# Patient Record
Sex: Female | Born: 1970 | Hispanic: Yes | Marital: Married
Health system: Southern US, Community
[De-identification: ages and names within clinical notes are randomized; demographics above are authoritative.]

---

## 2006-08-16 ENCOUNTER — Emergency Department: Payer: Self-pay | Admitting: Emergency Medicine

## 2009-09-22 ENCOUNTER — Ambulatory Visit: Payer: Self-pay

## 2012-01-18 ENCOUNTER — Ambulatory Visit: Payer: Self-pay

## 2012-01-30 ENCOUNTER — Ambulatory Visit: Payer: Self-pay

## 2013-06-26 ENCOUNTER — Ambulatory Visit: Payer: Self-pay

## 2016-05-23 NOTE — Progress Notes (Signed)
 Megan Santos is a 44 y.o. female here for new (WORK COMP) patient visit to discuss:  History of Present Illness:   1. Back pain: Comes in today with reports of bilateral posterior shoulder/upper back pain after catching a basket of falling socks while at work today 05/23/2016.  Denies numbness, weakness, tingling, bruising, redness, swelling.  Pain is worse with lifting.  Pain is worse with bending.  No history of back or shoulder injury or surgery.  No medications taken for symptoms.   The following portions of the patient's history were reviewed and updated as appropriate.  Past Medical History:  History reviewed. No pertinent past medical history.  Past Surgical History:  History reviewed. No pertinent surgical history.  Allergies:   Allergies  Allergen Reactions  . Penicillin Rash    Current Medications:   Prior to Admission medications   Not on File    Family History:  History reviewed. No pertinent family history.  Social History:   Social History   Social History  . Marital status: Married    Spouse name: N/A  . Number of children: N/A  . Years of education: N/A   Occupational History  . Not on file.   Social History Main Topics  . Smoking status: Never Smoker  . Smokeless tobacco: Not on file  . Alcohol use Not on file  . Drug use: Not on file  . Sexual activity: Not on file   Other Topics Concern  . Not on file   Social History Narrative  . No narrative on file    Review of Systems:   As per HPI  Vitals:   Vitals:   05/23/16 1730  BP: 126/82  Pulse: 101  Temp: 36.4 C (97.5 F)  TempSrc: Oral  SpO2: 98%  Weight: (!) 114.3 kg (252 lb)  Height: 157.5 cm (5' 2)     Body mass index is 46.09 kg/(m^2).  Physical Exam:   General:  Well appearing, pleasant, no acute distress,  Mouth:  Moist mucous membranes Lungs:  Clear to auscultation bilaterally without wheeze, rale or rhonchi Heart:  Regular rate and rhythm without murmur or  gallop Back: No erythema edema or ecchymosis noted.  Patient is tender to palpation in bilateral thoracic paraspinal muscles and overlying skin change.  No lumbar paraspinal muscle tenderness to palpation.  No thoracic or lumbar spinous process tenderness to palpation.  Decreased range of motion flexion of the thoracolumbar spine secondary to reported pain. Skin:  Warm and dry with good turgor Neurologic:  Alert.  5 out of 5 strength bilateral lower extremities with hip flexion, knee extension, ankle flexion and extension.  Sensation intact light touch bilateral lower extremities.  Plain film thoracic spine:   No acute bony process. Degenerative changes noted.  Assessment and Plan:   1. Acute upper back pain, unspecified  (primary encounter diagnosis) Encounter related to worker's compensation claim: Acute.  Likely secondary to muscle strain.  Pain films as above without acute bony process however degenerative changes noted multilevel.  Baclofen for muscle relaxer.  Naproxen for pain and inflammation.  Over-the-counter Tylenol for pain.  Follow-up as per patient instructions.  Restricted duty as per patient instructions.  -     baclofen (LIORESAL) 10 MG tablet; Take 1 tablet (10 mg total) by mouth 3 (three) times daily as needed (muscle spasm). -     naproxen (NAPROSYN) 500 MG tablet; Take 1 tablet (500 mg total) by mouth 2 (two) times daily as needed (pain) for  up to 30 doses.    Portions of this note were created using dictation software and may contain typographical errors.   Patient received an After Visit Summary

## 2016-05-30 NOTE — Progress Notes (Signed)
 Megan Santos is a 45 y.o. female here for established (WORK COMP) patient visit to discuss:  History of Present Illness:   1. Back pain: Comes in today for follow up of bilateral posterior shoulder/upper back pain after catching a basket of falling socks while at work on 05/23/2016.  Plain film of the lumbar spine demonstrated degenerative changes without acute process.  Pain was felt to be muscle strain related.  Treated with baclofen, naproxen.  Here for follow-up. Pain resolved.  Denies numbness, weakness, tingling, bruising, redness, swelling.  No history of back or shoulder injury or surgery.    The following portions of the patient's history were reviewed and updated as appropriate.  Past Medical History:  History reviewed. No pertinent past medical history.  Past Surgical History:  History reviewed. No pertinent surgical history.  Allergies:   Allergies  Allergen Reactions  . Penicillin Rash    Current Medications:   Prior to Admission medications   Medication Sig Taking? Last Dose  baclofen (LIORESAL) 10 MG tablet Take 1 tablet (10 mg total) by mouth 3 (three) times daily as needed (muscle spasm). Yes Taking  naproxen (NAPROSYN) 500 MG tablet Take 1 tablet (500 mg total) by mouth 2 (two) times daily as needed (pain) for up to 30 doses. Yes Taking    Family History:  No family history on file.  Social History:   Social History   Social History  . Marital status: Married    Spouse name: N/A  . Number of children: N/A  . Years of education: N/A   Occupational History  . Not on file.   Social History Main Topics  . Smoking status: Never Smoker  . Smokeless tobacco: Not on file  . Alcohol use Not on file  . Drug use: Not on file  . Sexual activity: Not on file   Other Topics Concern  . Not on file   Social History Narrative    Review of Systems:   As per HPI  Vitals:   Vitals:   05/30/16 1545  BP: 117/80  Pulse: 79  Temp: 36.7 C (98 F)   SpO2: 93%  Weight: (!) 115.2 kg (254 lb)     Body mass index is 46.46 kg/(m^2).  Physical Exam:   General:  Well appearing, pleasant, no acute distress,  Mouth:  Moist mucous membranes Back: No erythema edema or ecchymosis noted. Non tender to palpation bilateral thoracic paraspinal muscles. No lumbar paraspinal muscle tenderness to palpation.  No thoracic or lumbar spinous process tenderness to palpation. Full range of motion flexion of the thoracolumbar spine Skin:  Warm and dry with good turgor Neurologic:  Alert.  5 out of 5 strength bilateral lower extremities with hip flexion, knee extension, ankle flexion and extension.  Sensation intact light touch bilateral lower extremities.  Assessment and Plan:   1. Acute upper back pain, unspecified  (primary encounter diagnosis) Encounter related to worker's compensation claim: Acute. Resolved.  Return to regular duty.  Follow-up as needed.  -     baclofen (LIORESAL) 10 MG tablet; Take 1 tablet (10 mg total) by mouth 3 (three) times daily as needed (muscle spasm). -     naproxen (NAPROSYN) 500 MG tablet; Take 1 tablet (500 mg total) by mouth 2 (two) times daily as needed (pain) for up to 30 doses.    Portions of this note were created using dictation software and may contain typographical errors.   Patient received an After Visit Summary

## 2019-05-22 ENCOUNTER — Other Ambulatory Visit: Payer: Self-pay | Admitting: Family Medicine

## 2019-05-22 DIAGNOSIS — Z1231 Encounter for screening mammogram for malignant neoplasm of breast: Secondary | ICD-10-CM

## 2019-06-14 ENCOUNTER — Other Ambulatory Visit: Payer: Self-pay

## 2019-06-19 ENCOUNTER — Other Ambulatory Visit: Payer: Self-pay

## 2019-06-19 ENCOUNTER — Ambulatory Visit: Payer: Self-pay | Attending: Oncology | Admitting: *Deleted

## 2019-06-19 ENCOUNTER — Ambulatory Visit
Admission: RE | Admit: 2019-06-19 | Discharge: 2019-06-19 | Disposition: A | Discharge status: Home / Self Care | Payer: Self-pay | Source: Ambulatory Visit | Attending: Oncology | Admitting: Oncology

## 2019-06-19 ENCOUNTER — Encounter: Payer: Self-pay | Admitting: *Deleted

## 2019-06-19 VITALS — BP 161/72 | HR 81 | Temp 98.5°F | Ht 62.0 in | Wt 256.0 lb

## 2019-06-19 DIAGNOSIS — Z0000 Encounter for general adult medical examination without abnormal findings: Secondary | ICD-10-CM

## 2019-06-19 NOTE — Progress Notes (Signed)
  Subjective:     Patient ID: Megan Santos, female   DOB: [birth year], 48 y.o.   MRN: [id number]  HPI   Review of Systems     Objective:   Physical Exam Chest:     Breasts:        Right: No swelling, bleeding, inverted nipple, mass, nipple discharge, skin change or tenderness.        Left: No swelling, bleeding, inverted nipple, mass, nipple discharge, skin change or tenderness.  Abdominal:     Palpations: There is no hepatomegaly or splenomegaly.  Genitourinary:    Exam position: Lithotomy position.     Labia:        Right: No rash, tenderness, lesion or injury.        Left: No rash, tenderness, lesion or injury.      Vagina: No signs of injury and foreign body. No vaginal discharge, erythema, tenderness, bleeding, lesions or prolapsed vaginal walls.     Cervix: No cervical motion tenderness, discharge, friability, lesion, erythema, cervical bleeding or eversion.     Uterus: Not deviated, not enlarged, not fixed, not tender and no uterine prolapse.      Adnexa:        Right: No mass, tenderness or fullness.         Left: No mass, tenderness or fullness.       Rectum: No mass.     Lymphadenopathy:     Upper Body:     Right upper body: No supraclavicular or axillary adenopathy.     Left upper body: No supraclavicular or axillary adenopathy.        Assessment:     48 year old Hispanic female presents to [facility] for clinical breast exam, pap and mammogram.  Leola Brazil, the interpreter present during the interview and exam.  Clinical breast exam unremarkable.  Taught self breast awareness.  Specimen collected for pap smear without difficulty.  Patient has been screened for eligibility.  She does not have any insurance, Medicare or Medicaid.  She also meets financial eligibility.  Hand-out given on the Affordable Care Act.  Risk Assessment    Risk Scores      06/19/2019   Last edited by: Orson Slick, CMA   5-year risk: 0.4 %   Lifetime risk: 4.8 %            Plan:        Screening mammogram ordered.  Specimen for pap sent to the lab.  Will follow up per [facility] protocol.

## 2019-06-21 ENCOUNTER — Other Ambulatory Visit: Payer: Self-pay | Admitting: *Deleted

## 2019-06-21 DIAGNOSIS — N6489 Other specified disorders of breast: Secondary | ICD-10-CM

## 2019-06-28 LAB — PAP LB AND HPV HIGH-RISK: HPV, high-risk: NEGATIVE

## 2019-07-05 ENCOUNTER — Ambulatory Visit
Admission: RE | Admit: 2019-07-05 | Discharge: 2019-07-05 | Disposition: A | Discharge status: Home / Self Care | Payer: Self-pay | Source: Ambulatory Visit | Attending: Oncology | Admitting: Oncology

## 2019-07-05 DIAGNOSIS — N6489 Other specified disorders of breast: Secondary | ICD-10-CM | POA: Insufficient documentation

## 2019-07-08 ENCOUNTER — Other Ambulatory Visit: Payer: Self-pay | Admitting: *Deleted

## 2019-07-08 DIAGNOSIS — N63 Unspecified lump in unspecified breast: Secondary | ICD-10-CM

## 2019-07-18 ENCOUNTER — Ambulatory Visit
Admission: RE | Admit: 2019-07-18 | Discharge: 2019-07-18 | Disposition: A | Discharge status: Home / Self Care | Payer: Self-pay | Source: Ambulatory Visit | Attending: Oncology | Admitting: Oncology

## 2019-07-18 ENCOUNTER — Other Ambulatory Visit: Payer: Self-pay

## 2019-07-18 DIAGNOSIS — N63 Unspecified lump in unspecified breast: Secondary | ICD-10-CM

## 2019-07-18 HISTORY — PX: BREAST BIOPSY: SHX20

## 2019-07-19 LAB — SURGICAL PATHOLOGY

## 2019-07-25 ENCOUNTER — Encounter: Payer: Self-pay | Admitting: *Deleted

## 2019-07-25 NOTE — Progress Notes (Signed)
Letter mailed to inform patient of her normal pap smear.  Next pap due in 5 years.  Patient with benign breast biopsy.  Return to routine screening mammogram.  [facility] to [place].

## 2020-09-20 IMAGING — MG STEREOTACTIC VACUUM ASSIST RIGHT
8 of 17 series · 8 of 37 positions shown · non-contrast
Comparison: Previous exams.
COMPARISON: Previous exams.

Addendum:
CLINICAL DATA: Patient presents for biopsy of a small right breast
mass detected on screening mammogram.

EXAM:
RIGHT BREAST STEREOTACTIC CORE NEEDLE BIOPSY

[R (1 of 8)]
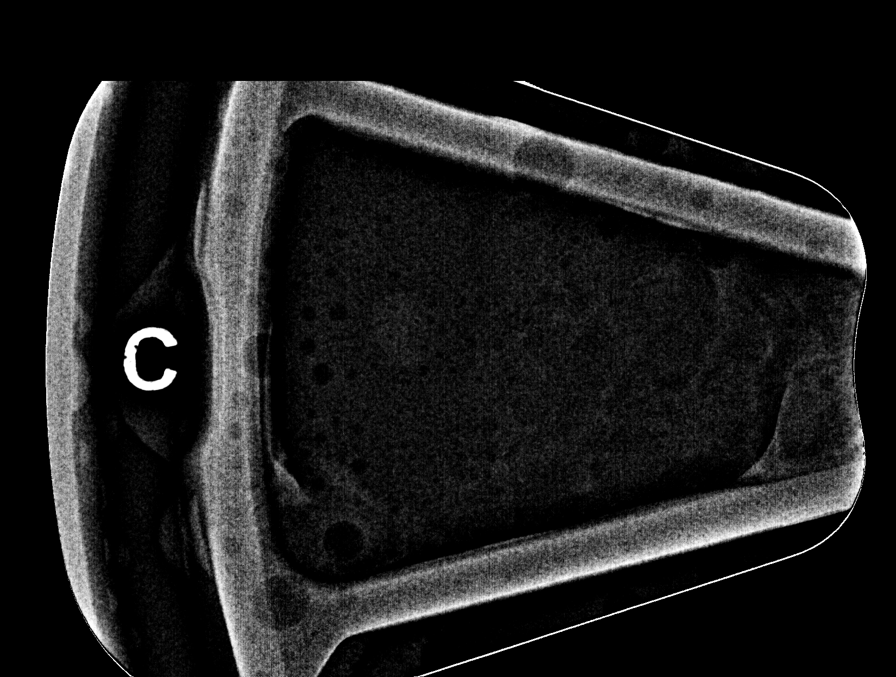

[R (2 of 8)]
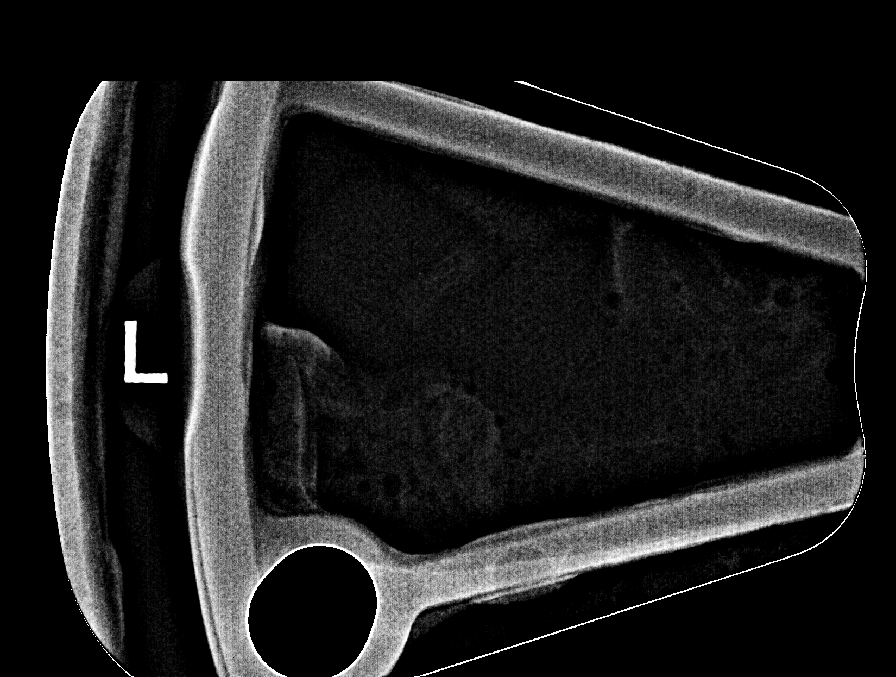

[R (3 of 8)]
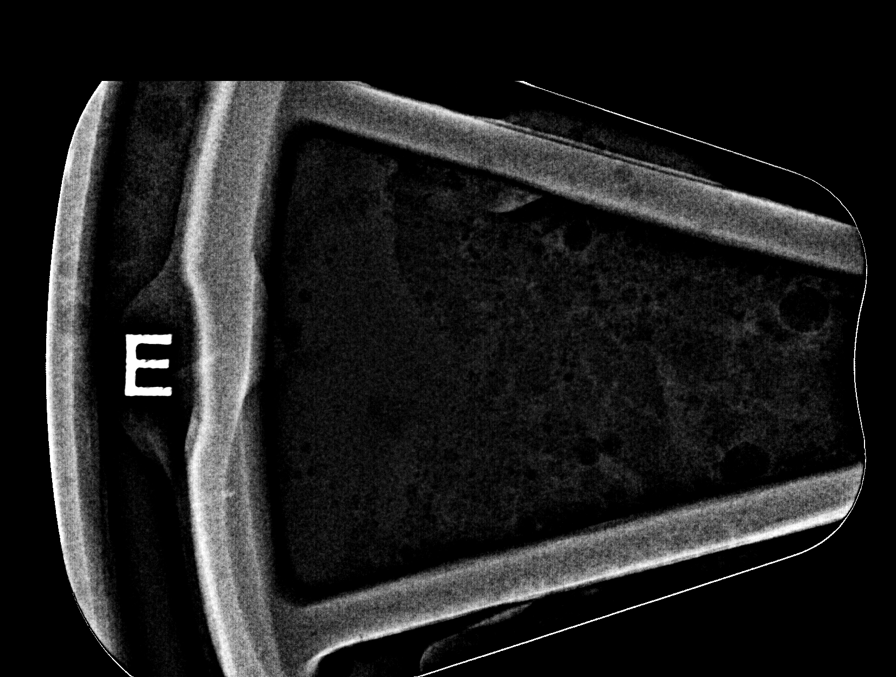

[R (4 of 8)]
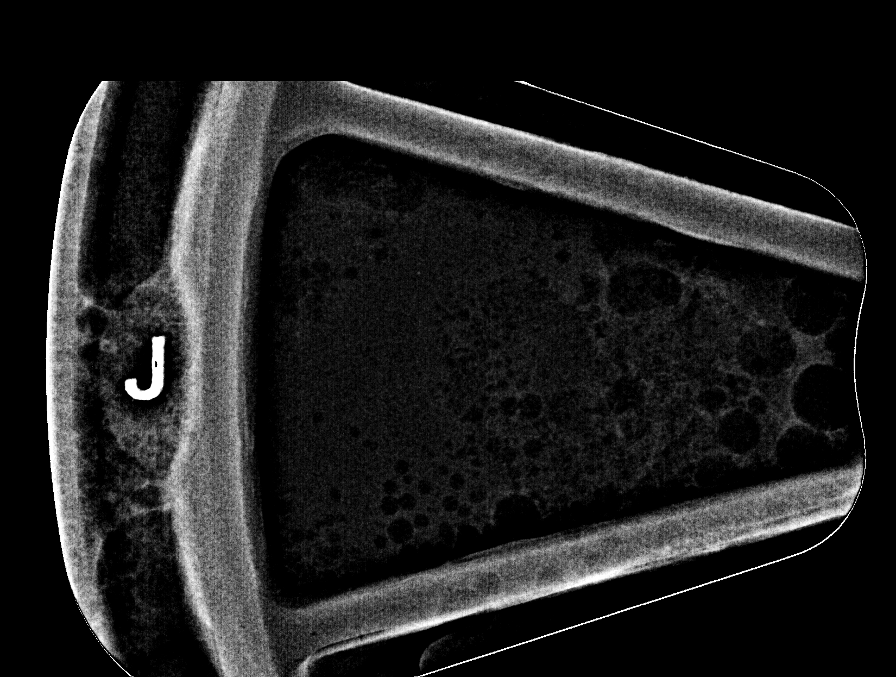

[R (5 of 8)]
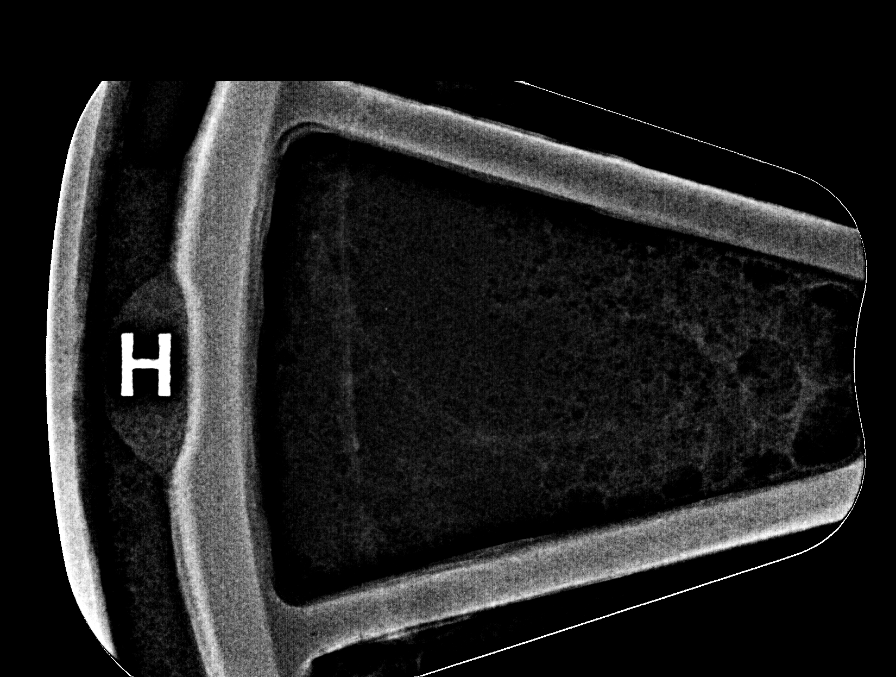

[R (6 of 8)]
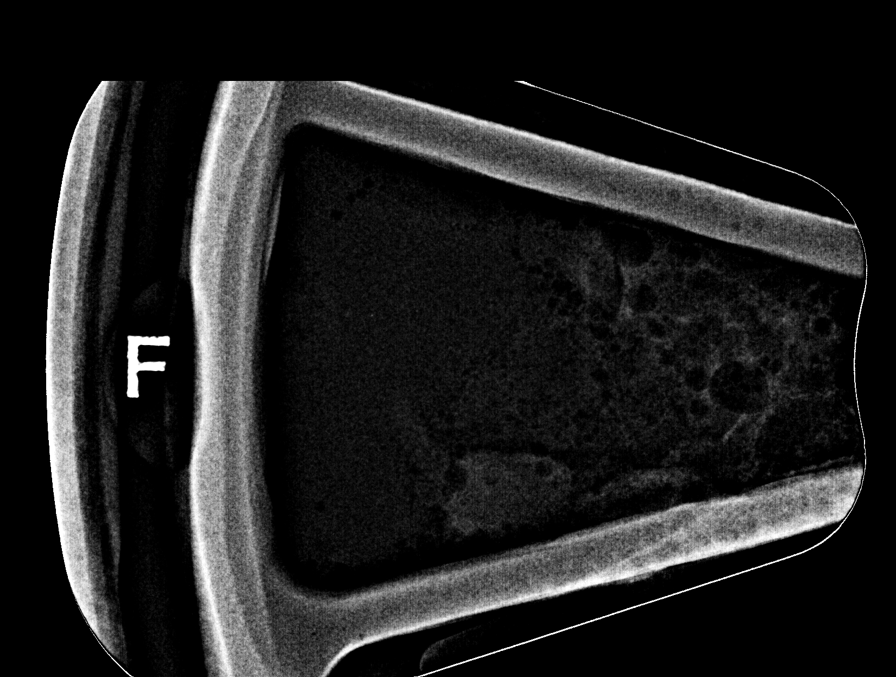

[R (7 of 8)]
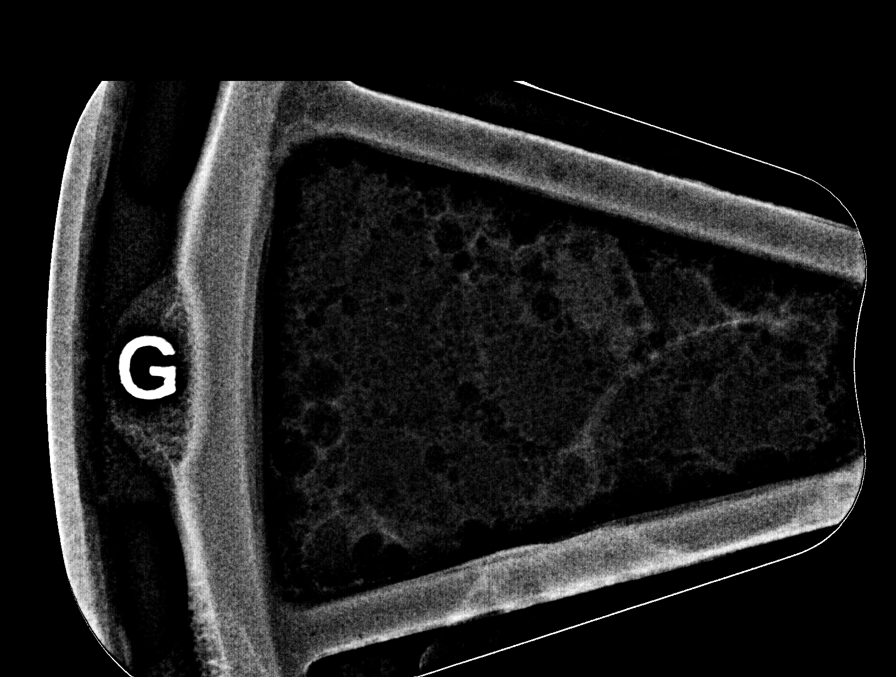

[R (8 of 8)]
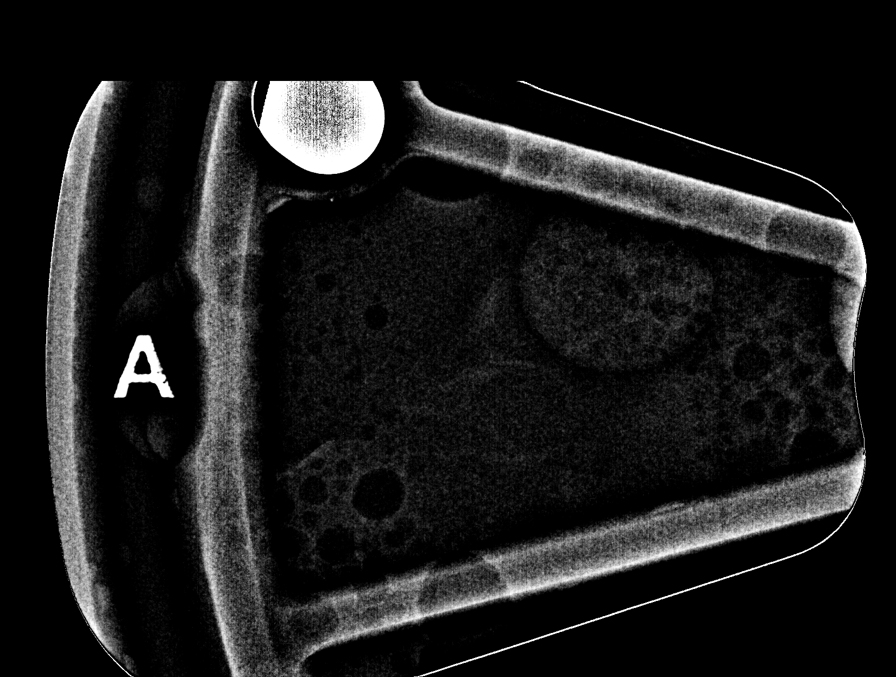

[8 of 37 positions shown; findings below may reference images not displayed]



Using sterile technique and 1% Lidocaine as local anesthetic, under
stereotactic guidance, a 9 gauge vacuum assisted device was used to
perform core needle biopsy of a right breast mass at 10 o'clock
using a superior approach.

Lesion quadrant: Upper outer quadrant

At the conclusion of the procedure, a X shaped and also a coiltissue
marker clip was deployed into the biopsy cavity. There was some
concern that the first biopsy clip was not deployed so a second clip
was also placed. Follow-up 2-view mammogram was performed and
dictated separately.
IMPRESSION: Stereotactic-guided biopsy of a right breast mass at 10 o'clock. No
apparent complications.

ADDENDUM:
Pathology of the RIGHT breast biopsy revealed RIGHT BREAST, [DATE], -
BENIGN BREAST TISSUE WITH PAPILLARY APOCRINE METAPLASIA AND COLUMNAR
CELL CHANGE. - NEGATIVE FOR ATYPIA AND MALIGNANCY.

This was found to be concordant with Dr. Nomasibulele Moatshe
impression and notes.

Recommendation: Bilateral screening mammogram in one year.

At the patient's request, results and recommendations were relayed
to the patient by 3-way phone call with the hospital interpreter and
Isla, Gisel ([REDACTED]) on 07/24/2019. The patient
stated she did well following the biopsy with no bleeding or pain.
Post biopsy instructions were reviewed and all of her questions were
answered. She was encouraged to contact the [HOSPITAL]
with any further questions or concerns. Results and recommendations
were also relayed to Olimpia Tiger,RN, [REDACTED] coordinator by [place]
Yount, [place].

Addendum by Isla, Gisel on 07/24/2019.



Using sterile technique and 1% Lidocaine as local anesthetic, under
stereotactic guidance, a 9 gauge vacuum assisted device was used to
perform core needle biopsy of a right breast mass at 10 o'clock
using a superior approach.

Lesion quadrant: Upper outer quadrant

At the conclusion of the procedure, a X shaped and also a coiltissue
marker clip was deployed into the biopsy cavity. There was some
concern that the first biopsy clip was not deployed so a second clip
was also placed. Follow-up 2-view mammogram was performed and
dictated separately.
IMPRESSION: Stereotactic-guided biopsy of a right breast mass at 10 o'clock. No
apparent complications.

## 2022-04-08 NOTE — Progress Notes (Signed)
 Subjective:    Chief Complaint  Patient presents with  . Generalized Body Aches    C/o body aches, chills, headache x8 days Denies any sore throat, no cough  Declined testing   . Rash    C/o rash on chest, itchy    History was provided by the patient. Megan Santos is a 51 y.o., female  who presents with 8-day history of persistent body aches, headache, fatigue, now also with erythematous maculopapular eruption anterior upper chest and posterior neck.  Denies significant sore throat.  Denies cough.  No complaints of dizziness; no chest pain or shortness of breath; no nausea or vomiting, diarrhea; no known fever.  Denies tick bites.  Reports typically walks and exercises during her lunch break at work, has been too tired to do so for the last 5 days.  Does not believe she has diabetes, has not been checked for some time. Symptoms include: above   Patient denies: above Treatment to date: above   History reviewed. No pertinent past medical history. The following portions of the patient's history were reviewed and updated as appropriate: allergies, current medications, past social history, and problem list.  Review of Systems A complete review of systems was performed.  Positive and pertinent negative responses are documented in the HPI, and all other systems are negative.   Objective:     Vitals:   04/08/22 1034  BP: 130/78  Pulse: 82  Temp: 36.8 C (98.3 F)  TempSrc: Oral  SpO2: 98%  Weight: (!) 113.1 kg (249 lb 6.4 oz)  Height: 154.9 cm (5' 1)    General Appearance: Moderately obese.  Pleasant and cooperative.  No acute distress.  Here with female companion. HEENT:  [place]/AT, PERRLA, EOMI, sclera clear, conjunctivae pink.   Mouth and throat: Posterior oropharynx moderate erythema; tongue midline. TM's: Marked erythema, effusion on right. Neck:  Supple, no JVD or adenopathy. Cardiovascular:  Regular rate and rhythm.  Lungs:  Clear to auscultation.  Abdomen:  Soft,  nontender, nondistended. Normoactive bowel sounds.  Back: No CVA tenderness. Extremities: No cyanosis, clubbing, or edema. Skin: With limited erythematous maculopapular eruption V shaped upper chest, limited posterior neck surrounding hairline. Neuro:  Alert and orient x4; nonfocal.    Lab/X-ray/Treatments:   CBC- 9.8K, 59% neutrophils, 31% lymphocytes Glucose- pending UA- large blood, rare bacteria       Assessment:      1. Acute bacterial sinusitis, unspecified - predniSONE (DELTASONE) 20 MG tablet; Take 1 tablet (20 mg total) by mouth 2 (two) times daily for 5 days  Dispense: 10 tablet; Refill: 0 - doxycycline (VIBRAMYCIN) 100 MG capsule; Take 1 capsule (100 mg total) by mouth 2 (two) times daily for 10 days  Dispense: 20 capsule; Refill: 0  2. Rash - predniSONE (DELTASONE) 20 MG tablet; Take 1 tablet (20 mg total) by mouth 2 (two) times daily for 5 days  Dispense: 10 tablet; Refill: 0 - doxycycline (VIBRAMYCIN) 100 MG capsule; Take 1 capsule (100 mg total) by mouth 2 (two) times daily for 10 days  Dispense: 20 capsule; Refill: 0  3. Fever, unspecified fever cause - CBC w/auto Differential (5 Part) - Glucose - Urinalysis w/Microscopic  4. Body aches - CBC w/auto Differential (5 Part) - Glucose - Urinalysis w/Microscopic  5. Frontal headache - CBC w/auto Differential (5 Part) - Glucose - Urinalysis w/Microscopic  Doxycycline, guaifenesin, prednisone, fluids and rest.  Work note provided.  Glucose-still pending.  Will call with result.  Upon discharge, patient reports perhaps  3 to 4 years ago she was told she had prediabetes.  Advised we may recommend her to PCP if glucose is elevated.  Addendum-glucose 153.  No immediate concerns.  Continue same.  Staff to notify.    Plan:   Requested Prescriptions   Signed Prescriptions Disp Refills  . predniSONE (DELTASONE) 20 MG tablet 10 tablet 0    Sig: Take 1 tablet (20 mg total) by mouth 2 (two) times daily for 5 days  .  doxycycline (VIBRAMYCIN) 100 MG capsule 20 capsule 0    Sig: Take 1 capsule (100 mg total) by mouth 2 (two) times daily for 10 days

## 2022-05-17 ENCOUNTER — Other Ambulatory Visit: Payer: Self-pay | Admitting: Obstetrics and Gynecology

## 2022-05-17 DIAGNOSIS — Z1231 Encounter for screening mammogram for malignant neoplasm of breast: Secondary | ICD-10-CM

## 2022-05-19 NOTE — Progress Notes (Signed)
 Subjective:     Patient ID: Megan Santos is a 51 y.o. female.  HPI: PRESENTS WITH FATIGUE AND NO ENERGY X 3 MONTHS. NO FEVER, NO COUGH OR CONGESTION. ALSO SHE REPORTS COCCYX PAIN X 6-8 MONTHS. WORK WHILE SITTING AT A M[employer]. POS FAMILY HX OF GB ISSUES. NO HX OF DM .   Past Medical History:  has no past medical history on file. Past Surgical History:  has no past surgical history on file. Family History: family history is not on file. Social History:  reports that she has never smoked. She does not have any smokeless tobacco history on file. She reports that she does not currently use alcohol. She reports that she does not use drugs. Prior to encounter Medications:  Current Outpatient Medications on File Prior to Visit  Medication Sig Dispense Refill  . naproxen (NAPROSYN) 500 MG tablet Take 1 tablet (500 mg total) by mouth 2 (two) times daily as needed (pain) for up to 30 doses. 30 tablet 0   No current facility-administered medications on file prior to visit.   Allergies: is allergic to penicillin.  This an established patient is here today for: office visit.  Review of Systems     Objective:   Physical Exam Vitals and nursing note reviewed.  Constitutional:      General: She is not in acute distress.    Appearance: Normal appearance. She is not ill-appearing.  HENT:     Head: Normocephalic and atraumatic.     Right Ear: Tympanic membrane and ear canal normal.     Left Ear: Tympanic membrane and ear canal normal.     Nose: Nose normal.     Mouth/Throat:     Mouth: Mucous membranes are moist.     Pharynx: Oropharynx is clear.  Eyes:     General: Scleral icterus present.     Conjunctiva/sclera: Conjunctivae normal.  Cardiovascular:     Rate and Rhythm: Normal rate and regular rhythm.  Pulmonary:     Effort: No respiratory distress.     Breath sounds: Normal breath sounds. No wheezing.  Genitourinary:    Comments: EVAL OF COCCYX AREA WITH FEMALE NURSING PERSONNEL  ASSISTING REVEALS NO SWELLING, NO SKIN CHANGES. NO RASH. ONLY PAIN TO PALPATION Musculoskeletal:     Cervical back: Normal range of motion and neck supple. No rigidity.  Lymphadenopathy:     Cervical: No cervical adenopathy.  Skin:    General: Skin is warm and dry.     Findings: No rash.  Neurological:     Mental Status: She is alert.       Assessment:     1. Coccyx pain   - X-ray sacrum and coccyx - predniSONE (DELTASONE) 10 mg tablet pack; 6 day taper. Take as directed with food  Dispense: 21 tablet; Refill: 0  2. Other fatigue   - CBC w/auto Differential (5 Part) - Comprehensive Metabolic Panel (CMP)  3. Elevated liver enzymes       Plan:     Patient Instructions  Your xray is good Your blood count is good Your blood sugar is alittle elevated Some of liver testes elevated. (Nothing to compare to)  Prednisone for coccys Donut pillow  Heat three times daily x 15 min Follow up in 10 days for repeat blood test

## 2022-05-30 NOTE — Progress Notes (Signed)
 OVER ALL IMPROVED. HERE TODAY TO HAVE LFT RECHECKE. NO NEW SYMPTOMS.

## 2022-07-19 ENCOUNTER — Ambulatory Visit: Payer: Self-pay

## 2022-09-30 ENCOUNTER — Ambulatory Visit: Payer: Self-pay | Admitting: Internal Medicine

## 2024-01-06 NOTE — Progress Notes (Signed)
 Chief Complaint: Chief Complaint  Patient presents with  . Abdominal Pain    Patient states about 3 hours ago she had a pain go a down her belly not across in the lower left side of her belly stated that she has never had that type of pain before stated that she could not be pregnant. Has started stretch exercises but not to strinuous    Megan Santos is a 53 y.o. female who presents today for evaluation of acute onset left lower quadrant pain.  The patient states that she was at work earlier today when she began to experience increased sharp pain along the left lower quadrant of her abdomen.  She states that she was not doing anything out of the ordinary.  She was not doing any heavy lifting.  This did not occur after eating.  She states that since then she has been experiencing off and on sharp pain, she reports that at rest the pain is quite mild but when it becomes severe she reports that it can be reach a 7 out of 10 in severity.  She denies any nausea and vomiting.  She denies any diarrhea.  She denies any urinary changes such as bloody urine or increased urine frequency.  She does state that when she goes to urinate she will have some slight increase in her discomfort.  She has not taken any medication for this at this time.  She does not report any abdominal surgical history, she does have a history of a C-section but no other  surgical history.  She states that she has not experienced this pain in the past.  The interpreter on wheels was utilized to gather information from the patient.  Past Medical History: No past medical history on file.  Past Surgical History: No past surgical history on file.  Past Family History: No family history on file.  Medications: Current Outpatient Medications  Medication Sig Dispense Refill  . naproxen (NAPROSYN) 500 MG tablet Take 1 tablet (500 mg total) by mouth 2 (two) times daily as needed (pain) for up to 30 doses. 30 tablet 0  . ciprofloxacin  HCl (CIPRO) 500 MG tablet Take 1 tablet (500 mg total) by mouth 2 (two) times daily for 10 days 20 tablet 0  . metroNIDAZOLE (FLAGYL) 500 MG tablet Take 1 tablet (500 mg total) by mouth every 8 (eight) hours for 10 days 30 tablet 0  . predniSONE (DELTASONE) 10 mg tablet pack 6 day taper. Take as directed with food (Patient not taking: Reported on 01/05/2024) 21 tablet 0  . promethazine (PHENERGAN) 25 MG tablet Take 1 tablet (25 mg total) by mouth every 6 (six) hours as needed for Nausea 20 tablet 0   No current facility-administered medications for this visit.    Allergies: Allergies  Allergen Reactions  . Penicillin Rash     Review of Systems:  A comprehensive 14 point ROS was performed, reviewed by me today, and the pertinent findings are documented in the HPI.   Exam: BP (!) 145/78   Pulse 80   Temp 36.5 C (97.7 F)   Ht 154.9 cm (5' 1)   Wt 95.7 kg (211 lb)   SpO2 97%   BMI 39.87 kg/m  General/Constitutional: The patient appears to be well-nourished, well-developed, and in no acute distress. Neuro/Psych: Normal mood and affect, oriented to person, place and time. Eyes: Non-icteric.  Pupils are equal, round, and reactive to light, and exhibit synchronous movement. ENT: Unremarkable. Lymphatic: No palpable  adenopathy. Respiratory: Lungs clear to auscultation, Normal chest excursion, No wheezes, and Non-labored breathing Cardiovascular: Regular rate and rhythm.  No murmurs. and No edema, swelling or tenderness, except as noted in detailed exam. Integumentary: No impressive skin lesions present, except as noted in detailed exam. Musculoskeletal: Unremarkable, except as noted in detailed exam.  Skin examination of the abdomen demonstrates previous C-section scar, there is no open wound or erythema.  No fluid can be palpated.  The abdomen is soft with palpation.  No masses palpated.  No rebound tenderness.  Intact bowel sounds in all 4 quadrants.  Nontender palpation to the left or  right upper quadrants or right lower quadrant.  She does have moderate pain with deep palpation over the left lower quadrant today's visit.  No rigidity is identified.  ImagingLabs: None.  Impression: Diverticulitis [K57.92] Diverticulitis  (primary encounter diagnosis) Abdominal pain, LLQ (left lower quadrant)  Plan:  1.  Treatment options were discussed today with the patient. 2.  The patient presents today with acute onset of left lower quadrant pain.  Discussed case with Dr. Delaine at today's appointment. 3.  I believe the patient may be developing diverticulitis at this time.  The patient was given a prescription for Flagyl, Cipro and Phenergan. 4.  Discussed strict return instructions with the patient.  She was instructed that if her abdominal pain worsens or fails to improve after several days to follow-up at the emergency room for further evaluation, we discussed the potential need for a CT scan in the future. 5.  The patient will follow-up if symptoms fail to improve.  They can call the clinic they have any questions, new symptoms develop or symptoms worsen.  This note was generated in part with voice recognition software and I apologize for any typographical errors that were not detected and corrected.  DOROTHA Gustavo Level, PA-C [facility]

## 2024-05-28 ENCOUNTER — Encounter: Payer: Self-pay | Admitting: Student

## 2024-05-28 ENCOUNTER — Ambulatory Visit
Admission: RE | Admit: 2024-05-28 | Discharge: 2024-05-28 | Disposition: A | Discharge status: Home / Self Care | Source: Ambulatory Visit | Attending: Student

## 2024-05-28 ENCOUNTER — Ambulatory Visit
Admission: RE | Admit: 2024-05-28 | Discharge: 2024-05-28 | Disposition: A | Discharge status: Home / Self Care | Attending: Student | Admitting: Student

## 2024-05-28 ENCOUNTER — Ambulatory Visit (INDEPENDENT_AMBULATORY_CARE_PROVIDER_SITE_OTHER): Admitting: Student

## 2024-05-28 VITALS — BP 126/74 | HR 71 | Ht 62.0 in | Wt 219.1 lb

## 2024-05-28 DIAGNOSIS — Z7984 Long term (current) use of oral hypoglycemic drugs: Secondary | ICD-10-CM | POA: Diagnosis not present

## 2024-05-28 DIAGNOSIS — Z6841 Body Mass Index (BMI) 40.0 and over, adult: Secondary | ICD-10-CM

## 2024-05-28 DIAGNOSIS — E119 Type 2 diabetes mellitus without complications: Secondary | ICD-10-CM | POA: Insufficient documentation

## 2024-05-28 DIAGNOSIS — M1812 Unilateral primary osteoarthritis of first carpometacarpal joint, left hand: Secondary | ICD-10-CM | POA: Diagnosis not present

## 2024-05-28 DIAGNOSIS — E66813 Obesity, class 3: Secondary | ICD-10-CM | POA: Diagnosis not present

## 2024-05-28 DIAGNOSIS — E669 Obesity, unspecified: Secondary | ICD-10-CM | POA: Insufficient documentation

## 2024-05-28 LAB — POCT GLYCOSYLATED HEMOGLOBIN (HGB A1C): Hemoglobin A1C: 7.2 % — AB (ref 4.0–5.6)

## 2024-05-28 MED ORDER — METFORMIN HCL ER 500 MG PO TB24: 500.0000 mg | ORAL_TABLET | Freq: Two times a day (BID) | ORAL | 1 refills | Status: AC

## 2024-05-28 MED ORDER — NAPROXEN 500 MG PO TABS: 500.0000 mg | ORAL_TABLET | Freq: Two times a day (BID) | ORAL | 0 refills | Status: AC

## 2024-05-28 NOTE — Assessment & Plan Note (Signed)
 She is working on dietary changes, offer nutrition referral for obesity and diabetes but she refused.

## 2024-05-28 NOTE — Progress Notes (Signed)
 New Patient Office Visit  Subjective    Patient ID: Megan Santos, female    DOB: 06-25-1971  Age: 53 y.o. MRN: [id number]  CC:  Chief Complaint  Patient presents with   Establish Care    Patient is here to establish care, having left thumb pain, on going for months, taking naproxen for it     HPI Megan Santos 53 year old person livingg with prediabetes and obesity presents to establish care.  Patient reports left thumb has been hurting for the past 8 months ago. She felt pain the top of the finger was jumping all of the time. Has been unable to straighten the thumb without pain. Was wearing thumb splint which was somewhat helpful but fel it was painful to wear for long periods of time and had stiffness. Stopped using splint about 3 months ago. 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 Had to stop working due to pain. Takes naproxen as needed for pain which is helpful but pain returns if she stops taking it. Has pain with pick up objects, opening jars, or pinching motion. She denies fever, chills, swelling, redness, or trauma.  Outpatient Encounter Medications as of 05/28/2024  Medication Sig   metFORMIN (GLUCOPHAGE-XR) 500 MG 24 hr tablet Take 1 tablet (500 mg total) by mouth 2 (two) times daily with a meal.   [DISCONTINUED] naproxen (NAPROSYN) 500 MG tablet Take 500 mg by mouth 2 (two) times daily. (Patient taking differently: Take 500 mg by mouth as needed.)   naproxen (NAPROSYN) 500 MG tablet Take 1 tablet (500 mg total) by mouth 2 (two) times daily with a meal.   No facility-administered encounter medications on file as of 05/28/2024.    History reviewed. No pertinent past medical history.  Past Surgical History:  Procedure Laterality Date   BREAST BIOPSY Right 07/18/2019   affirm stereo bx/x clip/path pending    History reviewed. No pertinent family history.  Social History   Socioeconomic History   Marital status: Married    Spouse  name: Not on file   Number of children: Not on file   Years of education: Not on file   Highest education level: Not on file  Occupational History   Not on file  Tobacco Use   Smoking status: Never   Smokeless tobacco: Never  Substance and Sexual Activity   Alcohol use: Never   Drug use: Not on file   Sexual activity: Not on file  Other Topics Concern   Not on file  Social History Narrative   Not on file   Social Drivers of Health   Financial Resource Strain: Not on file  Food Insecurity: No Food Insecurity (05/28/2024)   Hunger Vital Sign    Worried About Running Out of Food in the Last Year: Never true    Ran Out of Food in the Last Year: Never true  Transportation Needs: No Transportation Needs (05/28/2024)   PRAPARE - Administrator, Civil Service (Medical): No    Lack of Transportation (Non-Medical): No  Physical Activity: Not on file  Stress: Not on file  Social Connections: Not on file  Intimate Partner Violence: Not At Risk (05/28/2024)   Humiliation, Afraid, Rape, and Kick questionnaire    Fear of Current or Ex-Partner: No    Emotionally Abused: No    Physically Abused: No    Sexually Abused: No    ROS Refer to HPI    Objective  BP 126/74   Pulse 71   Ht 5' 2 (1.575 m)   Wt 219 lb 2 oz (99.4 kg)   LMP  (LMP Unknown) Comment: irregular, comes and goes  SpO2 98%   BMI 40.08 kg/m   Physical Exam Constitutional:      Appearance: She is obese.  HENT:     Mouth/Throat:     Mouth: Mucous membranes are moist.     Pharynx: Oropharynx is clear.   Cardiovascular:     Rate and Rhythm: Normal rate and regular rhythm.  Pulmonary:     Effort: Pulmonary effort is normal.     Breath sounds: No rhonchi or rales.  Abdominal:     General: Abdomen is flat. Bowel sounds are normal. There is no distension.     Palpations: Abdomen is soft.     Tenderness: There is no abdominal tenderness.   Musculoskeletal:     Comments: Pain with palpation of the left  MCP joint, pain with abduction and flexion. No warmth, edema, or redness, no other joint pain   Skin:    General: Skin is warm and dry.     Capillary Refill: Capillary refill takes less than 2 seconds.   Neurological:     General: No focal deficit present.     Mental Status: She is alert and oriented to person, place, and time.   Psychiatric:        Mood and Affect: Mood normal.        Behavior: Behavior normal.        05/28/2024    3:23 PM  Depression screen PHQ 2/9  Decreased Interest 1  Down, Depressed, Hopeless 1  PHQ - 2 Score 2  Altered sleeping 3  Tired, decreased energy 2  Change in appetite 0  Feeling bad or failure about yourself  1  Trouble concentrating 1  Moving slowly or fidgety/restless 0  Suicidal thoughts 0  PHQ-9 Score 9  Difficult doing work/chores Somewhat difficult      05/28/2024    3:23 PM  GAD 7 : Generalized Anxiety Score  Nervous, Anxious, on Edge 1  Control/stop worrying 1  Worry too much - different things 1  Trouble relaxing 1  Restless 0  Easily annoyed or irritable 0  Afraid - awful might happen 0  Total GAD 7 Score 4  Anxiety Difficulty Somewhat difficult      Assessment & Plan:  Type 2 diabetes mellitus without complication, without long-term current use of insulin (HCC) Assessment & Plan: Patient reports history of prediabetes and was prescribed metformin which she never picked up. She has been working on improving her diet as she did not want to be on medications. She reports last having labs for this 6 months ago. On chart review appears she had A1c of 9.3 on 02/05/2024. POCA1c today is 7.2%. Discussed A1c is still not well controlled despite lifestyle modifications. Will request prior PCP records. Start metformin 500 mg twice daily.  Discussed getting diabetic eye exam Will obtain labs at next visit.   Orders: -     POCT glycosylated hemoglobin (Hb A1C)  Primary osteoarthritis of first carpometacarpal joint of left  hand Assessment & Plan: Will obtain xrays of hand given chronicity of hand pain. RF and anti CCP was normal on 02/05/2024. Naproxen 500 mg twice daily for 2 weeks then as needed. Continue splinting when doing prolong activity. Follow up in 1 month. Discussed having her see sports medicine if not improving.   Orders: -  DG Hand Complete Left; Future  Class 3 severe obesity due to excess calories with serious comorbidity and body mass index (BMI) of 40.0 to 44.9 in adult Assessment & Plan: She is working on dietary changes, offer nutrition referral for obesity and diabetes but she refused.    Long term current use of oral hypoglycemic drug  Other orders -     Naproxen; Take 1 tablet (500 mg total) by mouth 2 (two) times daily with a meal.  Dispense: 60 tablet; Refill: 0 -     metFORMIN HCl ER; Take 1 tablet (500 mg total) by mouth 2 (two) times daily with a meal.  Dispense: 60 tablet; Refill: 1    Return for l thumb pain, DM.   Harlene Saddler, MD

## 2024-05-28 NOTE — Progress Notes (Signed)
 Megan Santos

## 2024-05-28 NOTE — Assessment & Plan Note (Signed)
 Will obtain xrays of hand given chronicity of hand pain. RF and anti CCP was normal on 02/05/2024. Naproxen 500 mg twice daily for 2 weeks then as needed. Continue splinting when doing prolong activity. Follow up in 1 month. Discussed having her see sports medicine if not improving.

## 2024-05-28 NOTE — Assessment & Plan Note (Signed)
 Patient reports history of prediabetes and was prescribed metformin which she never picked up. She has been working on improving her diet as she did not want to be on medications. She reports last having labs for this 6 months ago. On chart review appears she had A1c of 9.3 on 02/05/2024. POCA1c today is 7.2%. Discussed A1c is still not well controlled despite lifestyle modifications. Will request prior PCP records. Start metformin 500 mg twice daily.  Discussed getting diabetic eye exam Will obtain labs at next visit.

## 2024-05-29 ENCOUNTER — Ambulatory Visit: Payer: Self-pay | Admitting: Student

## 2024-07-02 ENCOUNTER — Ambulatory Visit: Admitting: Student
